# Patient Record
Sex: Male | Born: 1985 | Race: White | Hispanic: No | Marital: Married | Smoking: Current every day smoker
Health system: Southern US, Community
[De-identification: ages and names within clinical notes are randomized; demographics above are authoritative.]

---

## 2012-02-20 ENCOUNTER — Emergency Department (HOSPITAL_COMMUNITY)
Admission: EM | Admit: 2012-02-20 | Discharge: 2012-02-21 | Disposition: A | Discharge status: Home / Self Care | Payer: Self-pay | Attending: Emergency Medicine | Admitting: Emergency Medicine

## 2012-02-20 ENCOUNTER — Emergency Department (HOSPITAL_COMMUNITY): Payer: Self-pay

## 2012-02-20 ENCOUNTER — Encounter (HOSPITAL_COMMUNITY): Payer: Self-pay | Admitting: *Deleted

## 2012-02-20 DIAGNOSIS — R05 Cough: Secondary | ICD-10-CM | POA: Insufficient documentation

## 2012-02-20 DIAGNOSIS — J189 Pneumonia, unspecified organism: Secondary | ICD-10-CM | POA: Insufficient documentation

## 2012-02-20 DIAGNOSIS — R000 Tachycardia, unspecified: Secondary | ICD-10-CM | POA: Insufficient documentation

## 2012-02-20 DIAGNOSIS — R062 Wheezing: Secondary | ICD-10-CM | POA: Insufficient documentation

## 2012-02-20 DIAGNOSIS — R0602 Shortness of breath: Secondary | ICD-10-CM | POA: Insufficient documentation

## 2012-02-20 DIAGNOSIS — R059 Cough, unspecified: Secondary | ICD-10-CM | POA: Insufficient documentation

## 2012-02-20 DIAGNOSIS — J3489 Other specified disorders of nose and nasal sinuses: Secondary | ICD-10-CM | POA: Insufficient documentation

## 2012-02-20 DIAGNOSIS — F17200 Nicotine dependence, unspecified, uncomplicated: Secondary | ICD-10-CM | POA: Insufficient documentation

## 2012-02-20 MED ORDER — ALBUTEROL SULFATE (5 MG/ML) 0.5% IN NEBU
2.5000 mg | INHALATION_SOLUTION | Freq: Once | RESPIRATORY_TRACT | Status: AC
  Administered 2012-02-21: 2.5 mg via RESPIRATORY_TRACT
  Filled 2012-02-20: qty 0.5

## 2012-02-20 MED ORDER — PREDNISONE 20 MG PO TABS
60.0000 mg | ORAL_TABLET | Freq: Once | ORAL | Status: AC
  Administered 2012-02-21: 60 mg via ORAL
  Filled 2012-02-20: qty 3

## 2012-02-20 MED ORDER — IPRATROPIUM BROMIDE 0.02 % IN SOLN
0.5000 mg | Freq: Once | RESPIRATORY_TRACT | Status: AC
  Administered 2012-02-21: 0.5 mg via RESPIRATORY_TRACT
  Filled 2012-02-20: qty 2.5

## 2012-02-20 MED ORDER — SODIUM CHLORIDE 0.9 % IV BOLUS (SEPSIS)
1000.0000 mL | Freq: Once | INTRAVENOUS | Status: AC
  Administered 2012-02-21: 1000 mL via INTRAVENOUS

## 2012-02-20 NOTE — ED Notes (Signed)
Cold cough for 5-6 days productive cough today.

## 2012-02-21 ENCOUNTER — Emergency Department (HOSPITAL_COMMUNITY): Payer: Self-pay

## 2012-02-21 ENCOUNTER — Encounter (HOSPITAL_COMMUNITY): Payer: Self-pay | Admitting: Radiology

## 2012-02-21 MED ORDER — DEXTROSE 5 % IV SOLN
1.0000 g | Freq: Once | INTRAVENOUS | Status: AC
  Administered 2012-02-21: 01:00:00 via INTRAVENOUS
  Filled 2012-02-21: qty 10

## 2012-02-21 MED ORDER — AZITHROMYCIN 250 MG PO TABS: 250.0000 mg | ORAL_TABLET | Freq: Every day | ORAL | Status: AC

## 2012-02-21 MED ORDER — DEXTROSE 5 % IV SOLN
500.0000 mg | Freq: Once | INTRAVENOUS | Status: AC
  Administered 2012-02-21: 01:00:00 via INTRAVENOUS
  Filled 2012-02-21: qty 500

## 2012-02-21 MED ORDER — IOHEXOL 300 MG/ML  SOLN
100.0000 mL | Freq: Once | INTRAMUSCULAR | Status: AC | PRN
  Administered 2012-02-21: 100 mL via INTRAVENOUS

## 2012-02-21 NOTE — Discharge Instructions (Signed)
Pneumonia, Adult Pneumonia is an infection of the lungs. It may be caused by a germ (virus or bacteria). Some types of pneumonia can spread easily from person to person. This can happen when you cough or sneeze. HOME CARE  Only take medicine as told by your doctor.   Take your medicine (antibiotics) as told. Finish it even if you start to feel better.   Do not smoke.   You may use a vaporizer or humidifier in your room. This can help loosen thick spit (mucus).   Sleep so you are almost sitting up (semi-upright). This helps reduce coughing.   Rest.  A shot (vaccine) can help prevent pneumonia. Shots are often advised for:  People over 71 years old.   Patients on chemotherapy.   People with long-term (chronic) lung problems.   People with immune system problems.  GET HELP RIGHT AWAY IF:   You are getting worse.   You cannot control your cough, and you are losing sleep.   You cough up blood.   Your pain gets worse, even with medicine.   You have a fever.   Any of your problems are getting worse, not better.   You have shortness of breath or chest pain.  MAKE SURE YOU:   Understand these instructions.   Will watch your condition.   Will get help right away if you are not doing well or get worse.  Document Released: 04/24/2008 Document Revised: 10/26/2011 Document Reviewed: 01/27/2011 [facility] Patient Information 2012 [place], [place].  RESOURCE GUIDE  Dental Problems  Patients with Medicaid: [facility] [phone] [address]                                           [phone] [address] Phone:  [phone]                                                  Phone:  [phone]  If unable to pay or uninsured, contact:  [facility] or [facility] to become qualified for the adult dental clinic.  Chronic Pain Problems Contact [facility]  [phone] Patients need to be referred by their  primary care doctor.  Insufficient Money for Medicine Contact [employer] Way:  call "211" or [facility] [phone].  No Primary Care Doctor Call [facility]  979-330-0005 Other agencies that provide inexpensive medical care    [facility]  [phone]    [facility]  [phone]    [facility]  [phone]    [facility]  [phone]    [facility]  657-055-4712    [facility]  [phone]  Psychological Services [facility]  [phone] [facility] Services  [phone] [facility] Mental Health   [phone] (emergency services [phone])  Substance Abuse Resources [facility]  [phone] [facility] [phone] [facility] [place] [phone] Floydene Flock [phone] Residential & Outpatient Substance Abuse Program  [phone]  Abuse/Neglect [facility] Child Abuse Hotline 614-554-8782 [facility] Child Abuse Hotline (704)383-4335 (After Hours)  Emergency Shelter [place]  [employer] [phone]  Maternity Homes Room at the [place] [phone] [facility] ([phone]  MRSA Hotline #:   914-512-8669    [facility] Resources  [facility]     [employer]                          [facility] Dept. [address] [place]                       [address]      [address]  Blondell Reveal Phone:  [phone]                                   Phone:  [phone]                 Phone:  [phone]  [facility] Mental [facility] Phone:  [phone]  [facility] Child Abuse Hotline [phone] (After Hours)

## 2012-02-21 NOTE — ED Provider Notes (Signed)
History    26 year old male with cough and congestion. Gradual onset almost a week ago. No fevers or chills. Cough is nonproductive. Feels generally achy. No significant chest pain. No unusual leg pain or swelling. Denies history of blood clot. No significant past medical history. Patient is a smoker. Has a context the CSN: [id number]  Arrival date & time 02/20/12  2224   First MD Initiated Contact with Patient 02/20/12 2331      Chief Complaint  Patient presents with  . Cough    (Consider location/radiation/quality/duration/timing/severity/associated sxs/prior treatment) HPI  History reviewed. No pertinent past medical history.  History reviewed. No pertinent past surgical history.  No family history on file.  History  Substance Use Topics  . Smoking status: Current Everyday Smoker  . Smokeless tobacco: Not on file  . Alcohol Use: Yes      Review of Systems   Review of symptoms negative unless otherwise noted in HPI.   Allergies  Review of patient's allergies indicates no known allergies.  Home Medications   Current Outpatient Rx  Name Route Sig Dispense Refill  . AZITHROMYCIN 250 MG PO TABS Oral Take 1 tablet (250 mg total) by mouth daily. 2 pills (500mg ) day 1 then 1 pill (250mg ) days 2-5. 6 tablet 0    BP 116/67  Pulse 112  Temp(Src) 98.1 F (36.7 C) (Oral)  Resp 18  SpO2 95%  Physical Exam  Nursing note and vitals reviewed. Constitutional: He appears well-developed and well-nourished. No distress.  HENT:  Head: Normocephalic and atraumatic.  Eyes: Conjunctivae are normal. Right eye exhibits no discharge. Left eye exhibits no discharge.  Neck: Normal range of motion. Neck supple.  Cardiovascular: Regular rhythm and normal heart sounds.  Exam reveals no gallop and no friction rub.   No murmur heard.      Mildly tachycardic with a regular rhythm  Pulmonary/Chest: Effort normal. No respiratory distress. He has wheezes. He exhibits no tenderness.   No increased work of breathing. Moderate wheezing bilaterally.  Abdominal: Soft. He exhibits no distension. There is no tenderness.  Musculoskeletal: He exhibits no edema and no tenderness.       Lower extremities symmetric as compared to each other. There is no calf tenderness. Negative Homans.  Neurological: He is alert.  Skin: Skin is warm and dry.  Psychiatric: He has a normal mood and affect. His behavior is normal. Thought content normal.    ED Course  Procedures (including critical care time)  Labs Reviewed - No data to display Dg Chest 2 View  02/20/2012  *RADIOLOGY REPORT*  Clinical Data: Shortness of breath and cough.  CHEST - 2 VIEW  Comparison: None  Findings: The cardiac silhouette, mediastinal and hilar contours are within normal limits.  Mild bronchitic type changes, likely related to smoking.  No infiltrates, edema or effusions.  The bony thorax is intact.  IMPRESSION: Mild bronchitic changes, likely related to smoking.  No infiltrates, effusions or edema.  Original Report Authenticated By: P. Loralie Champagne, M.D.   Ct Chest W Contrast  02/21/2012  *RADIOLOGY REPORT*  Clinical Data: Productive cough  CT CHEST WITH CONTRAST  Technique:  Multidetector CT imaging of the chest was performed following the standard protocol during bolus administration of intravenous contrast.  Contrast:  100 ml Omnipaque 300  Comparison: Chest radiograph 02/20/2012  Findings: Normal heart size.  Normal caliber thoracic aorta.  No dissection.  Linear lucency along the anterior margin is probably due to motion artifact.  Mediastinal lymph nodes are  not pathologically enlarged.  Infiltration in the anterior mediastinal fat is likely due to residual thymic tissue.  Esophagus is decompressed. Low attenuation change in the liver consistent with diffuse fatty infiltration.  Visualized portions of the upper abdominal organs are otherwise grossly unremarkable.  No pleural effusion.  Patchy focal areas of  airspace/ground-glass infiltration in the upper lungs bilaterally.  Changes may represent acute pneumonia.  Slight infiltration or atelectasis in the right lung base.  No pneumothorax. Normal alignment of the thoracic vertebra. No destructive bone lesions appreciated.  Mild degenerative changes.  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  IMPRESSION: Focal patchy areas of airspace infiltration in the upper lungs and right lung base which may represent pneumonia.  Original Report Authenticated By: Marlon Pel, M.D.   EKG:  Rhythm: Sinus tachycardia Rate: 118 Axis: Normal to borderline right Intervals: Incomplete right bundle branch block. Crit atrial enlargement ST segments: Nonspecific ST changes   1. CAP (community acquired pneumonia)       MDM  26 year old male with cough. Noted to be tachycardic as well. EKG with some evidence of right heart strain. CT of the chest was preformed to evaluate for possible pulmonary embolism and was negative in this regard. It did show some patchy infiltrates concerning for possible pneumonia though. Aside from mild tachycardia patient is well appearing. He did have some mild wheezing on exam but no increased work of breathing and oxygen saturations are 100% on room air. Feel that patient can be appropriately treated as an outpatient for community-acquired pneumonia particularly when also considering his age and lack of significant comorbidities. Strict return precautions were discussed. Outpatient followup.        Raeford Razor, MD 02/27/12 2225

## 2014-03-21 IMAGING — CT CT CHEST W/ CM
1 of 2 series · 14 of 30 positions shown, 18 images · IV contrast (APPLIED)
Comparison: Chest radiograph 02/20/2012

CLINICAL DATA: Productive cough

CT CHEST WITH CONTRAST
TECHNIQUE: Multidetector CT imaging of the chest was performed
following the standard protocol during bolus administration of
intravenous contrast.
Contrast:  100 ml Omnipaque 300

[Series 2: routine chest 5.0 st · axial · 0.81mm/px · z∈[-450,-124]mm · 14 of 77 slices shown, 18 images]
[im 6/77  mediastinal]
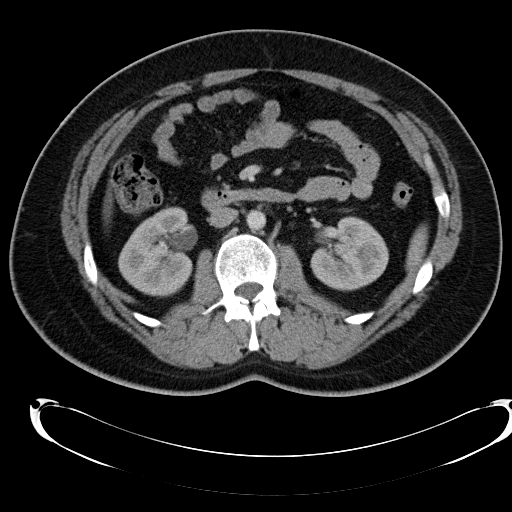
[im 6/77  lung]
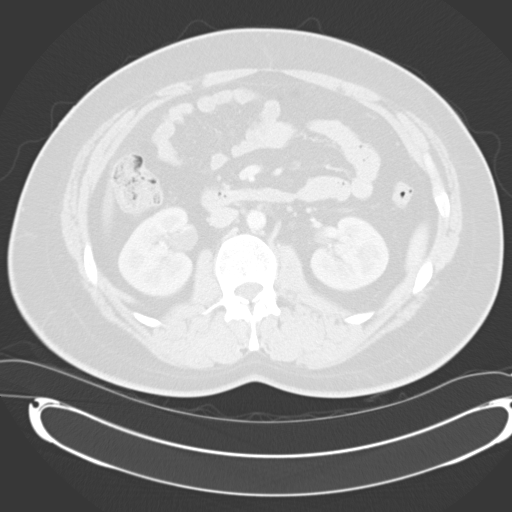
[im 11/77  lung]
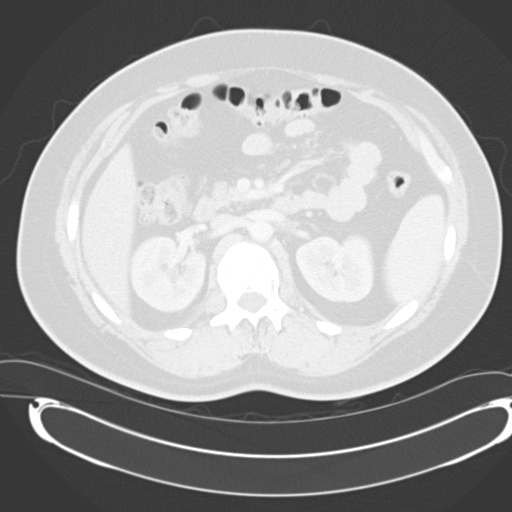
[im 17/77  lung]
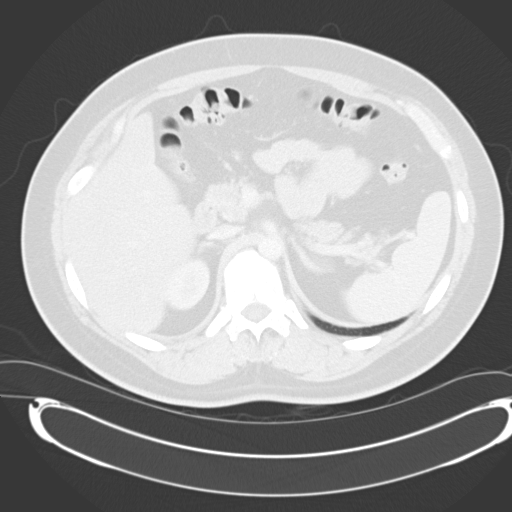
[im 22/77  lung]
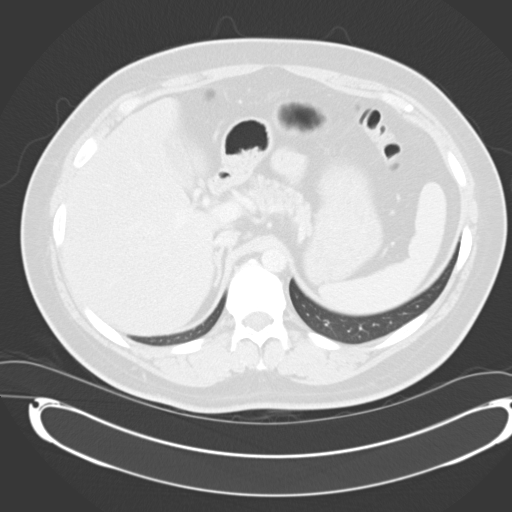
[im 28/77  mediastinal]
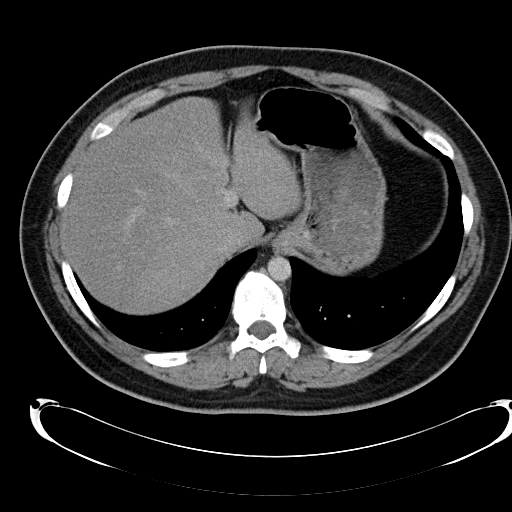
[im 28/77  lung]
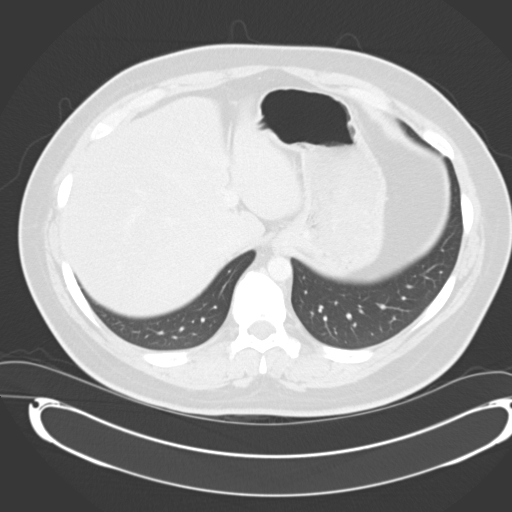
[im 33/77  lung]
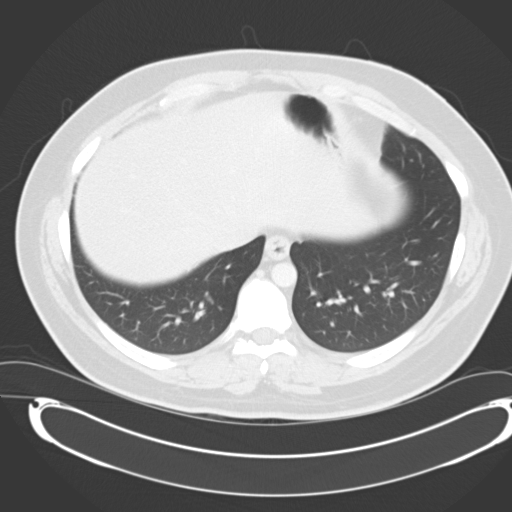
[im 37/77  lung]
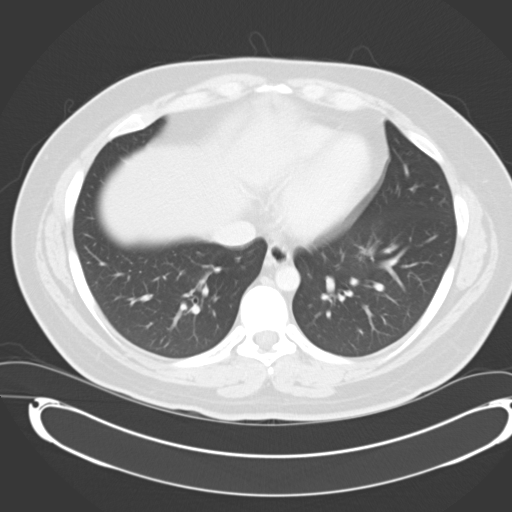
[im 39/77  lung]
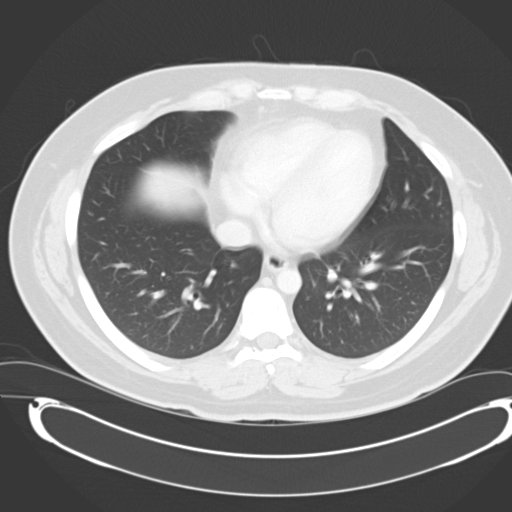
[im 44/77  mediastinal]
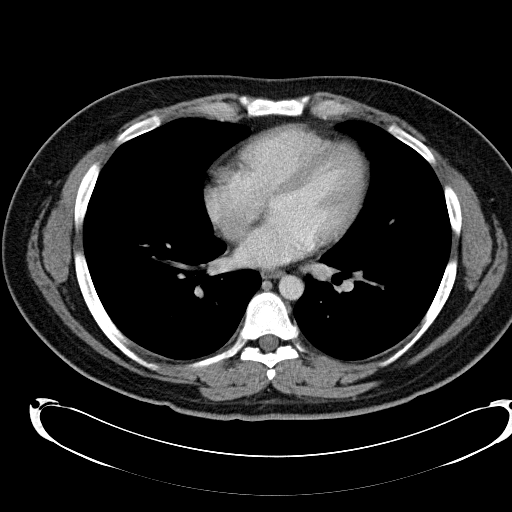
[im 44/77  lung]
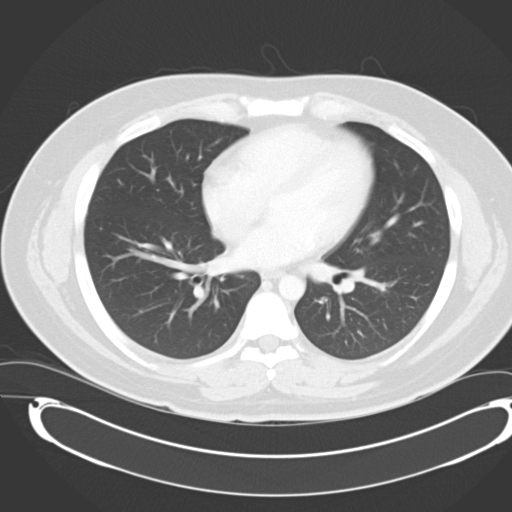
[im 49/77  lung]
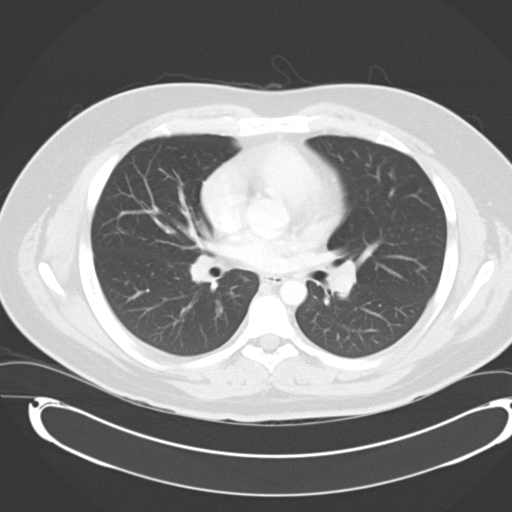
[im 55/77  lung]
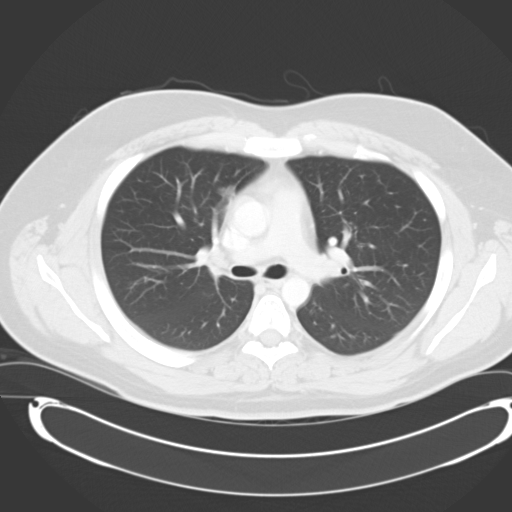
[im 60/77  lung]
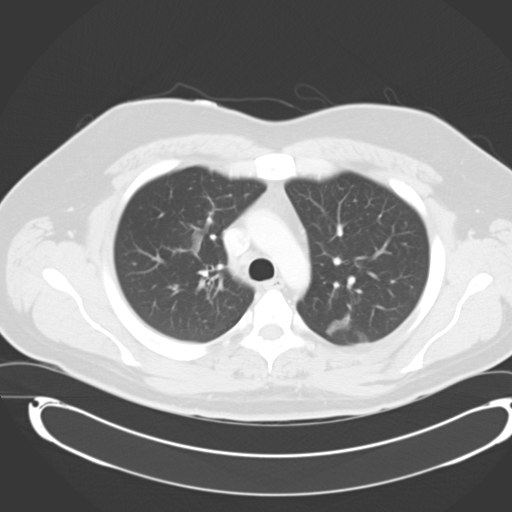
[im 66/77  mediastinal]
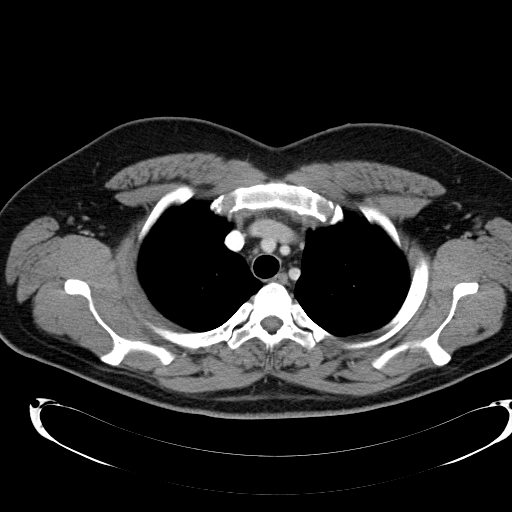
[im 66/77  lung]
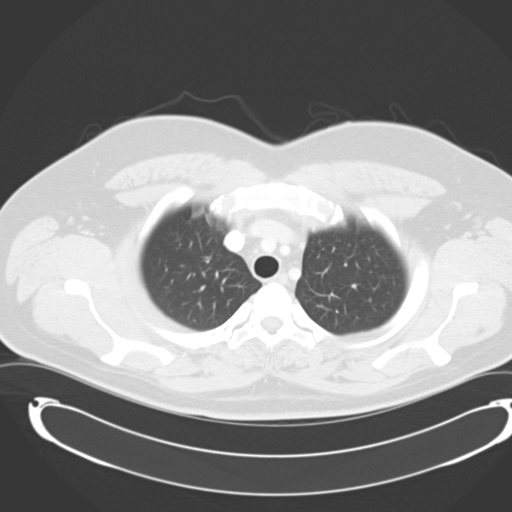
[im 71/77  lung]
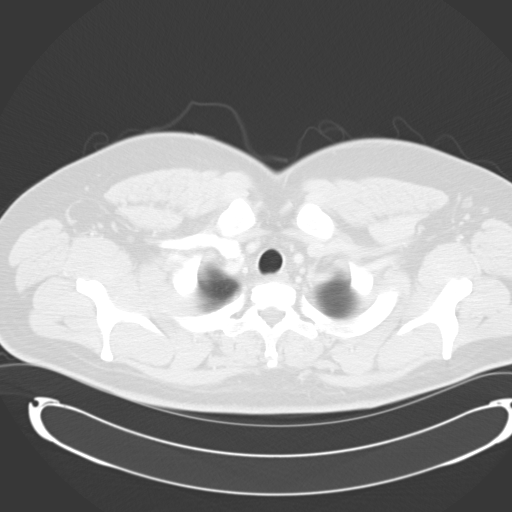

[14 of 30 positions shown; findings below may reference images not displayed]

FINDINGS: Normal heart size.  Normal caliber thoracic aorta.  No
dissection.  Linear lucency along the anterior margin is probably
due to motion artifact.  Mediastinal lymph nodes are not
pathologically enlarged.  Infiltration in the anterior mediastinal
fat is likely due to residual thymic tissue.  Esophagus is
decompressed. Low attenuation change in the liver consistent with
diffuse fatty infiltration.  Visualized portions of the upper
abdominal organs are otherwise grossly unremarkable.  No pleural
effusion.  Patchy focal areas of airspace/ground-glass infiltration
in the upper lungs bilaterally.  Changes may represent acute
pneumonia.  Slight infiltration or atelectasis in the right lung
base.  No pneumothorax. Normal alignment of the thoracic vertebra.
No destructive bone lesions appreciated.  Mild degenerative
changes.  Incidental note of old ununited ossicles over the spinous
processes of C7 and T1.
IMPRESSION: Focal patchy areas of airspace infiltration in the upper lungs and
right lung base which may represent pneumonia.

## 2018-11-28 ENCOUNTER — Other Ambulatory Visit: Payer: Self-pay

## 2018-11-28 ENCOUNTER — Encounter (HOSPITAL_COMMUNITY): Payer: Self-pay | Admitting: Emergency Medicine

## 2018-11-28 ENCOUNTER — Emergency Department (HOSPITAL_COMMUNITY): Payer: Self-pay

## 2018-11-28 ENCOUNTER — Emergency Department (HOSPITAL_COMMUNITY)
Admission: EM | Admit: 2018-11-28 | Discharge: 2018-11-28 | Disposition: A | Discharge status: Home / Self Care | Payer: Self-pay | Attending: Emergency Medicine | Admitting: Emergency Medicine

## 2018-11-28 DIAGNOSIS — Y9321 Activity, ice skating: Secondary | ICD-10-CM | POA: Insufficient documentation

## 2018-11-28 DIAGNOSIS — W000XXA Fall on same level due to ice and snow, initial encounter: Secondary | ICD-10-CM | POA: Insufficient documentation

## 2018-11-28 DIAGNOSIS — R52 Pain, unspecified: Secondary | ICD-10-CM

## 2018-11-28 DIAGNOSIS — W19XXXA Unspecified fall, initial encounter: Secondary | ICD-10-CM

## 2018-11-28 DIAGNOSIS — F17200 Nicotine dependence, unspecified, uncomplicated: Secondary | ICD-10-CM | POA: Insufficient documentation

## 2018-11-28 DIAGNOSIS — Y92330 Ice skating rink (indoor) (outdoor) as the place of occurrence of the external cause: Secondary | ICD-10-CM | POA: Insufficient documentation

## 2018-11-28 DIAGNOSIS — S20211A Contusion of right front wall of thorax, initial encounter: Secondary | ICD-10-CM | POA: Insufficient documentation

## 2018-11-28 DIAGNOSIS — Y999 Unspecified external cause status: Secondary | ICD-10-CM | POA: Insufficient documentation

## 2018-11-28 MED ORDER — LIDOCAINE 5 % EX PTCH: 1.0000 | MEDICATED_PATCH | CUTANEOUS | 0 refills | Status: AC

## 2018-11-28 MED ORDER — DICLOFENAC SODIUM ER 100 MG PO TB24: 100.0000 mg | ORAL_TABLET | Freq: Every day | ORAL | 0 refills | Status: AC

## 2018-11-28 MED ORDER — NAPROXEN 500 MG PO TABS
500.0000 mg | ORAL_TABLET | Freq: Once | ORAL | Status: AC
  Administered 2018-11-28: 500 mg via ORAL
  Filled 2018-11-28: qty 1

## 2018-11-28 MED ORDER — LIDOCAINE 5 % EX PTCH
2.0000 | MEDICATED_PATCH | CUTANEOUS | Status: DC
  Administered 2018-11-28: 2 via TRANSDERMAL
  Filled 2018-11-28: qty 2

## 2018-11-28 MED ORDER — ACETAMINOPHEN 500 MG PO TABS: 1000.0000 mg | ORAL_TABLET | Freq: Once | ORAL | Status: DC

## 2018-11-28 NOTE — ED Triage Notes (Signed)
Pt c/o SOB post fall while ice skating . C/o pain to Right rib area that increases with deep breathing

## 2018-11-28 NOTE — ED Notes (Signed)
Patient transported to X-ray 

## 2018-11-28 NOTE — ED Notes (Signed)
Pt shown how to use incentive spirometer

## 2018-11-28 NOTE — ED Notes (Signed)
Pt and visitor verbalized discharge instructions and follow up care. Ambulatory and alert.

## 2018-11-28 NOTE — ED Notes (Signed)
Pt ambulated from triage to room 18 without any assistance. Steady gait

## 2018-11-28 NOTE — ED Provider Notes (Signed)
[facility]-EMERGENCY DEPT Provider Note   CSN: [id number] Arrival date & time: 11/28/18  [id number]     History   Chief Complaint Chief Complaint  Patient presents with  . Shortness of Breath  . Fall    HPI Stephen James is a 33 y.o. male.  The history is provided by the patient.  Fall  This is a new problem. The current episode started more than 2 days ago. The problem occurs rarely. The problem has not changed since onset.Pertinent negatives include no abdominal pain, no headaches and no shortness of breath. Associated symptoms comments: Pain in the ribs he hit on the ice . Nothing aggravates the symptoms. Nothing relieves the symptoms. He has tried acetaminophen for the symptoms. The treatment provided no relief.  fell on the ice and has been having pain in the right ribs since.  Worse with movement.  No leg pain or swelling no long car trips or plane trips.  No coughing up blood has not been bed bound.  Worse bending over.  Initially, did not take anything for pain but for the last few days has taken one dose of tylenol daily.   History reviewed. No pertinent past medical history.  There are no active problems to display for this patient.   History reviewed. No pertinent surgical history.      Home Medications    Prior to Admission medications   Not on File    Family History History reviewed. No pertinent family history.  Social History Social History   Tobacco Use  . Smoking status: Current Every Day Smoker  . Smokeless tobacco: Never Used  Substance Use Topics  . Alcohol use: Yes  . Drug use: Never     Allergies   Patient has no known allergies.   Review of Systems Review of Systems  Constitutional: Negative for diaphoresis and fever.  Respiratory: Negative for chest tightness and shortness of breath.   Cardiovascular: Negative for palpitations and leg swelling.  Gastrointestinal: Negative for abdominal pain.  Neurological:  Negative for headaches.  All other systems reviewed and are negative.    Physical Exam Updated Vital Signs BP 131/72 (BP Location: Left Arm)   Pulse 79   Temp 97.6 F (36.4 C) (Oral)   Resp 11   Ht 6' (1.829 m)   Wt 129.3 kg   SpO2 99%   BMI 38.65 kg/m   Physical Exam Constitutional:      General: He is not in acute distress.    Appearance: He is well-developed. He is not ill-appearing, toxic-appearing or diaphoretic.  HENT:     Head: Normocephalic and atraumatic.     Mouth/Throat:     Mouth: Mucous membranes are moist.  Eyes:     Pupils: Pupils are equal, round, and reactive to light.  Neck:     Musculoskeletal: Normal range of motion.  Cardiovascular:     Rate and Rhythm: Normal rate and regular rhythm.     Pulses: Normal pulses.     Heart sounds: Normal heart sounds.  Pulmonary:     Effort: Pulmonary effort is normal.     Breath sounds: Normal breath sounds. No decreased breath sounds.  Chest:     Chest wall: No deformity or crepitus.  Abdominal:     General: Bowel sounds are normal.     Palpations: Abdomen is soft.  Musculoskeletal: Normal range of motion.     Right lower leg: He exhibits no tenderness.     Left  lower leg: He exhibits no tenderness.  Lymphadenopathy:     Cervical: No cervical adenopathy.  Skin:    General: Skin is warm and dry.     Capillary Refill: Capillary refill takes less than 2 seconds.  Neurological:     General: No focal deficit present.     Mental Status: He is alert.  Psychiatric:        Mood and Affect: Mood normal.        Behavior: Behavior normal.      ED Treatments / Results  Labs (all labs ordered are listed, but only abnormal results are displayed) Labs Reviewed - No data to display  EKG  EKG Interpretation  Date/Time:  Thursday November 28 2018 05:04:06 EST Ventricular Rate:  75 PR Interval:    QRS Duration: 128 QT Interval:  407 QTC Calculation: 455 R Axis:   35 Text Interpretation:  Sinus rhythm  Incomplete right bundle branch block Confirmed by Nicanor Alcon, Gretna Bergin ([id number]) on 11/28/2018 5:07:00 AM       Radiology Dg Chest 2 View  Result Date: 11/28/2018 CLINICAL DATA:  Initial evaluation for right-sided chest pain/rib pain, history of recent fall. EXAM: CHEST - 2 VIEW COMPARISON:  Prior radiograph from 02/20/2012. FINDINGS: Cardiac and mediastinal silhouette stable in size and contour, and remain within normal limits. Lungs normally inflated. No focal infiltrates. No pulmonary edema or pleural effusion. No visible pneumothorax. No acute osseous abnormality. No visible displaced rib fracture identified. IMPRESSION: 1. No active cardiopulmonary disease. 2. No acute osseous abnormality identified. Electronically Signed   By: Rise Mu M.D.   On: 11/28/2018 04:32    Procedures Procedures (including critical care time)  Medications Ordered in ED Medications  lidocaine (LIDODERM) 5 % 2 patch (2 patches Transdermal Patch Applied 11/28/18 0446)  acetaminophen (TYLENOL) tablet 1,000 mg (1,000 mg Oral Not Given 11/28/18 0449)  naproxen (NAPROSYN) tablet 500 mg (500 mg Oral Given 11/28/18 0446)     PERC nagative wells 0;  highly doubt PE in this low risk patient.    Final Clinical Impressions(s) / ED Diagnoses  Rib contusion, incentive spirometer given and pain medication given.   Strict return precautions for persistent pain, bloody sputum, tachycardia or any concerns.    Return for falls, fevers >100.4 unrelieved by medication, shortness of breath, intractable vomiting, or diarrhea, abdominal pain, Inability to tolerate liquids or food, cough, altered mental status or any concerns. No signs of systemic illness or infection. The patient is nontoxic-appearing on exam and vital signs are within normal limits.   I have reviewed the triage vital signs and the nursing notes. Pertinent labs &imaging results that were available during my care of the patient were reviewed by me and considered in  my medical decision making (see chart for details).  After history, exam, and medical workup I feel the patient has been appropriately medically screened and is safe for discharge home. Pertinent diagnoses were discussed with the patient. Patient was given return    Rondalyn Belford, MD 11/28/18 [phone]

## 2018-11-28 NOTE — ED Notes (Signed)
EKG given to EDP,Palumbo,MD., for review. 

## 2020-12-26 IMAGING — CR DG CHEST 2V
2 series · 2 of 2 positions shown · non-contrast
Comparison: Prior radiograph from 02/20/2012.

CLINICAL DATA: Initial evaluation for right-sided chest pain/rib
pain, history of recent fall.

EXAM:
CHEST - 2 VIEW

[w chest pa]
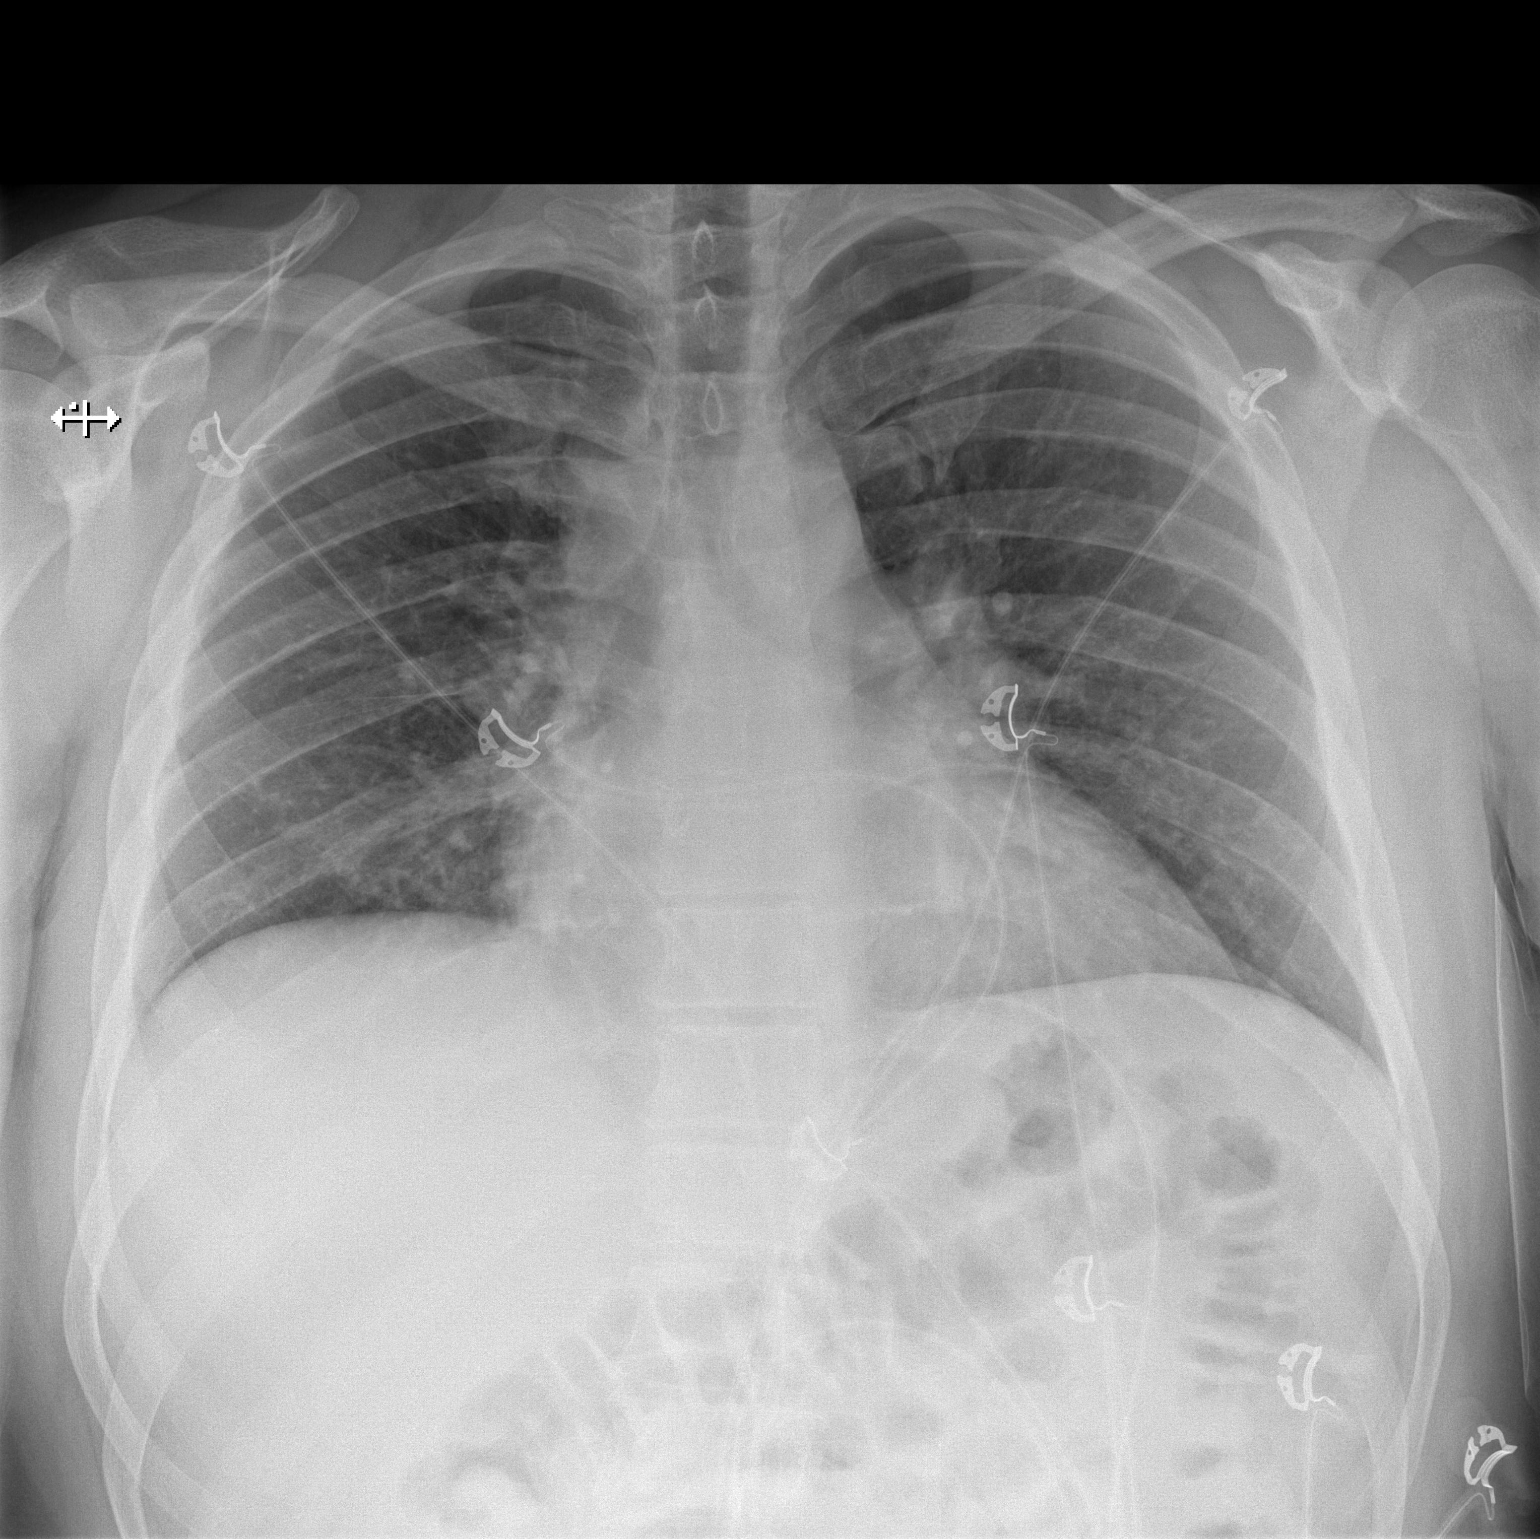

[w chest lat]
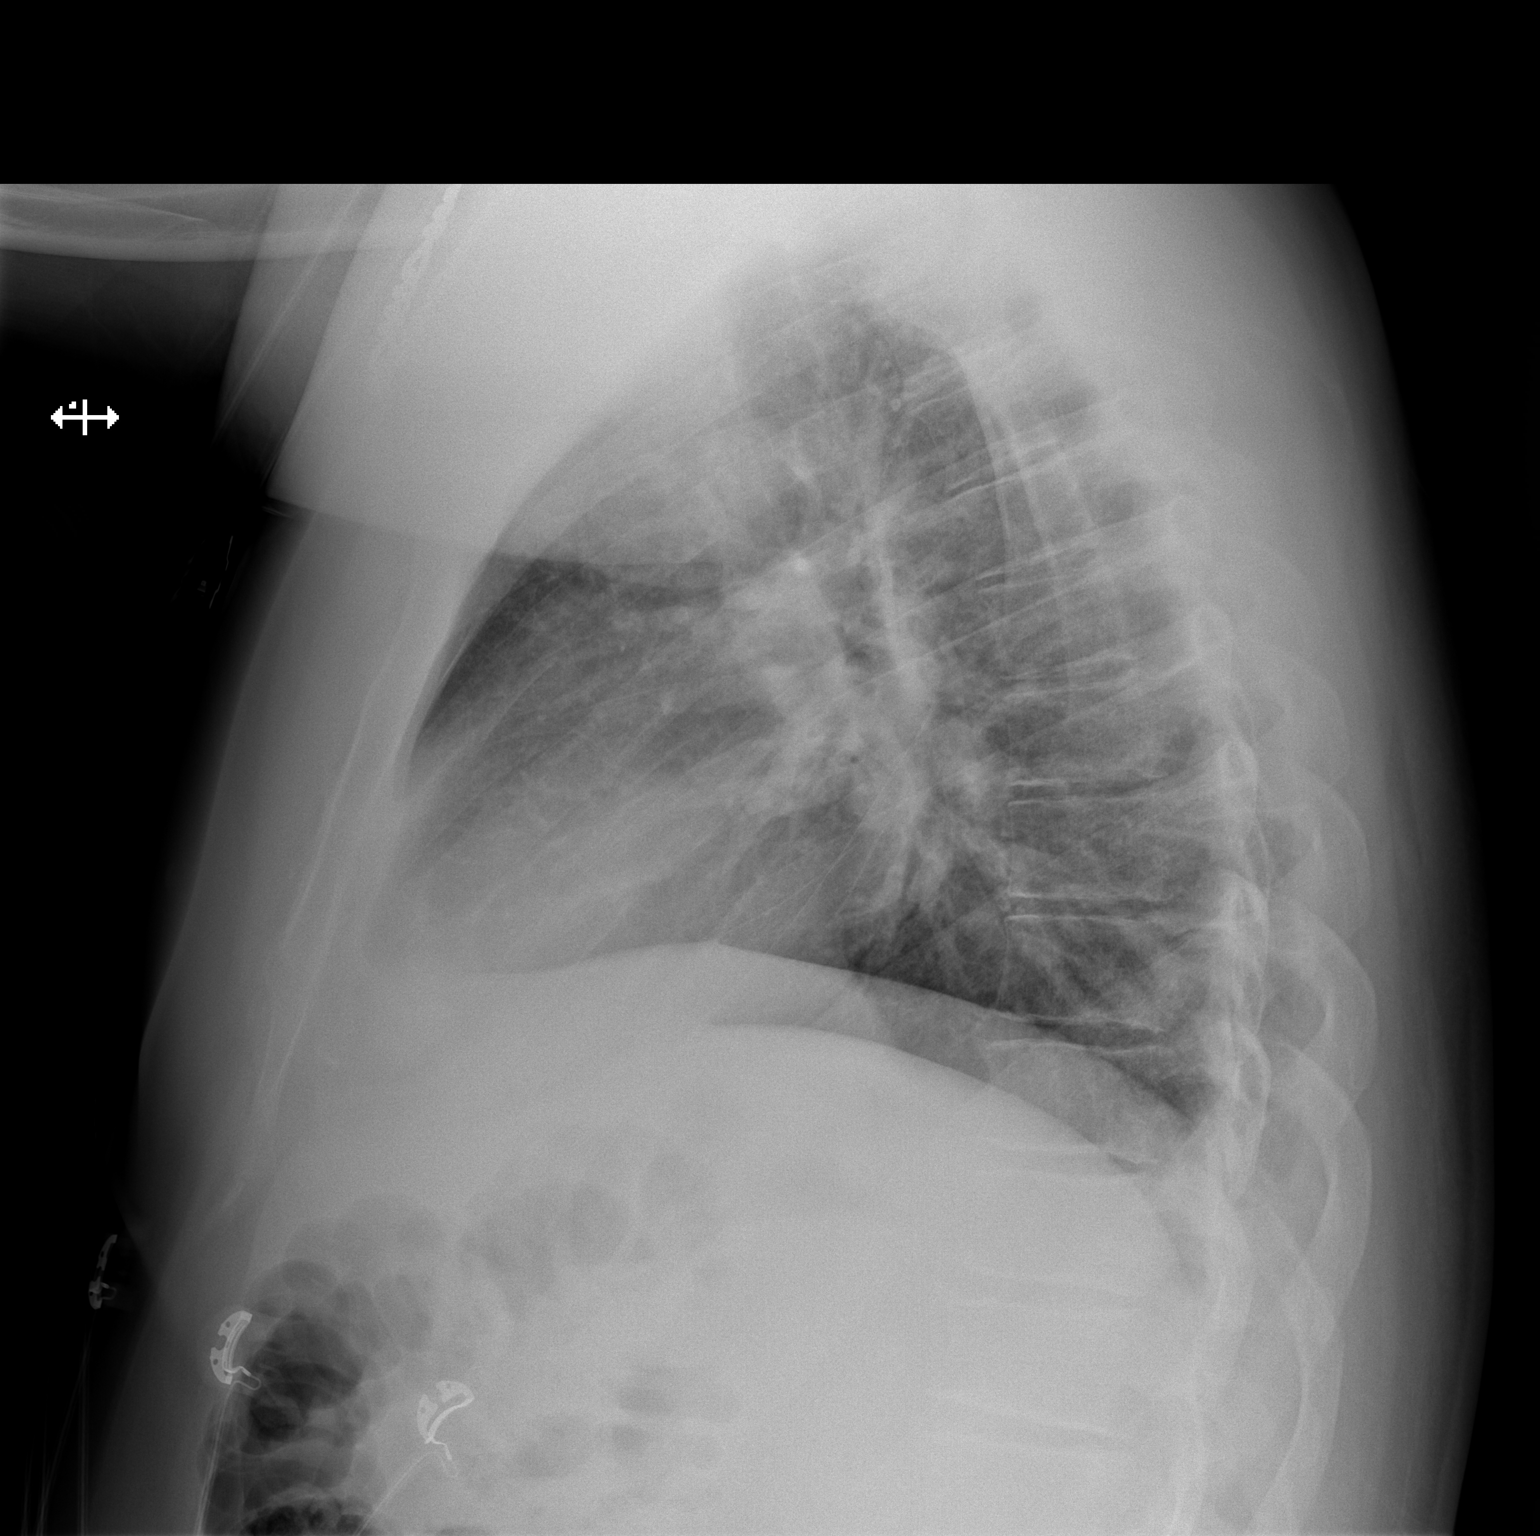

[2 of 2 positions shown; findings below may reference images not displayed]

FINDINGS: Cardiac and mediastinal silhouette stable in size and contour, and
remain within normal limits.

Lungs normally inflated. No focal infiltrates. No pulmonary edema or
pleural effusion. No visible pneumothorax.

No acute osseous abnormality. No visible displaced rib fracture
identified.
IMPRESSION: 1. No active cardiopulmonary disease.
2. No acute osseous abnormality identified.
